# Patient Record
Sex: Male | Born: 1990 | Race: White | Hispanic: No | Marital: Single | Smoking: Never smoker
Health system: Southern US, Community
[De-identification: ages and names within clinical notes are randomized; demographics above are authoritative.]

## PROBLEM LIST (undated history)

## (undated) ENCOUNTER — Ambulatory Visit: Payer: Self-pay

---
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
## 2012-04-06 ENCOUNTER — Ambulatory Visit: Payer: Self-pay | Admitting: Family Medicine

## 2012-04-24 IMAGING — CR RIGHT ELBOW - COMPLETE 3+ VIEW
1 series · 4 of 4 positions shown · non-contrast
Comparison: none

REASON FOR EXAM: abscess
COMMENTS:   LMP: (Male)

PROCEDURE:     DXR - DXR ELBOW RT COMP W/OBLIQUES  - June 09, 2011 [DATE]
RESULT:     There is no evidence of fracture, dislocation, or malalignment.
There is no evidence of cortical destruction nor subcutaneous emphysema to
suggest the sequela of osteomyelitis.

[Series 1: x elbow lat right · 0.14mm/px · 4 of 4 slices shown]
[im 1/4]
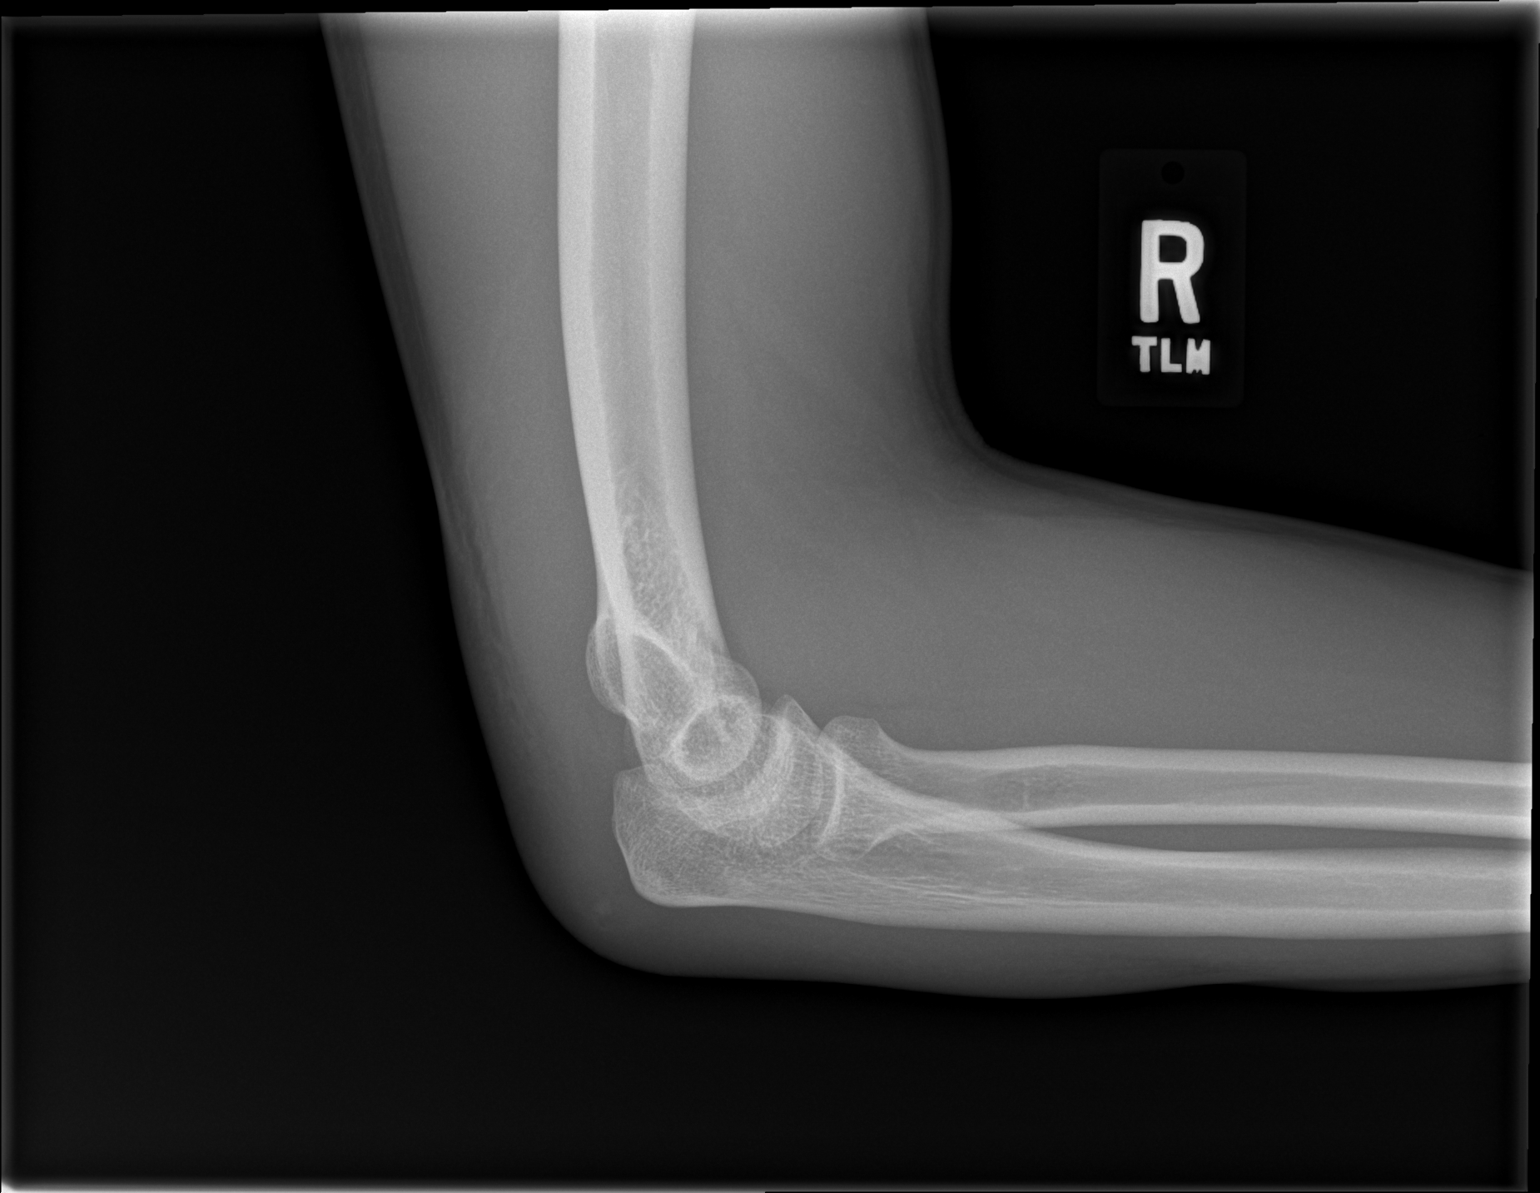
[im 2/4]
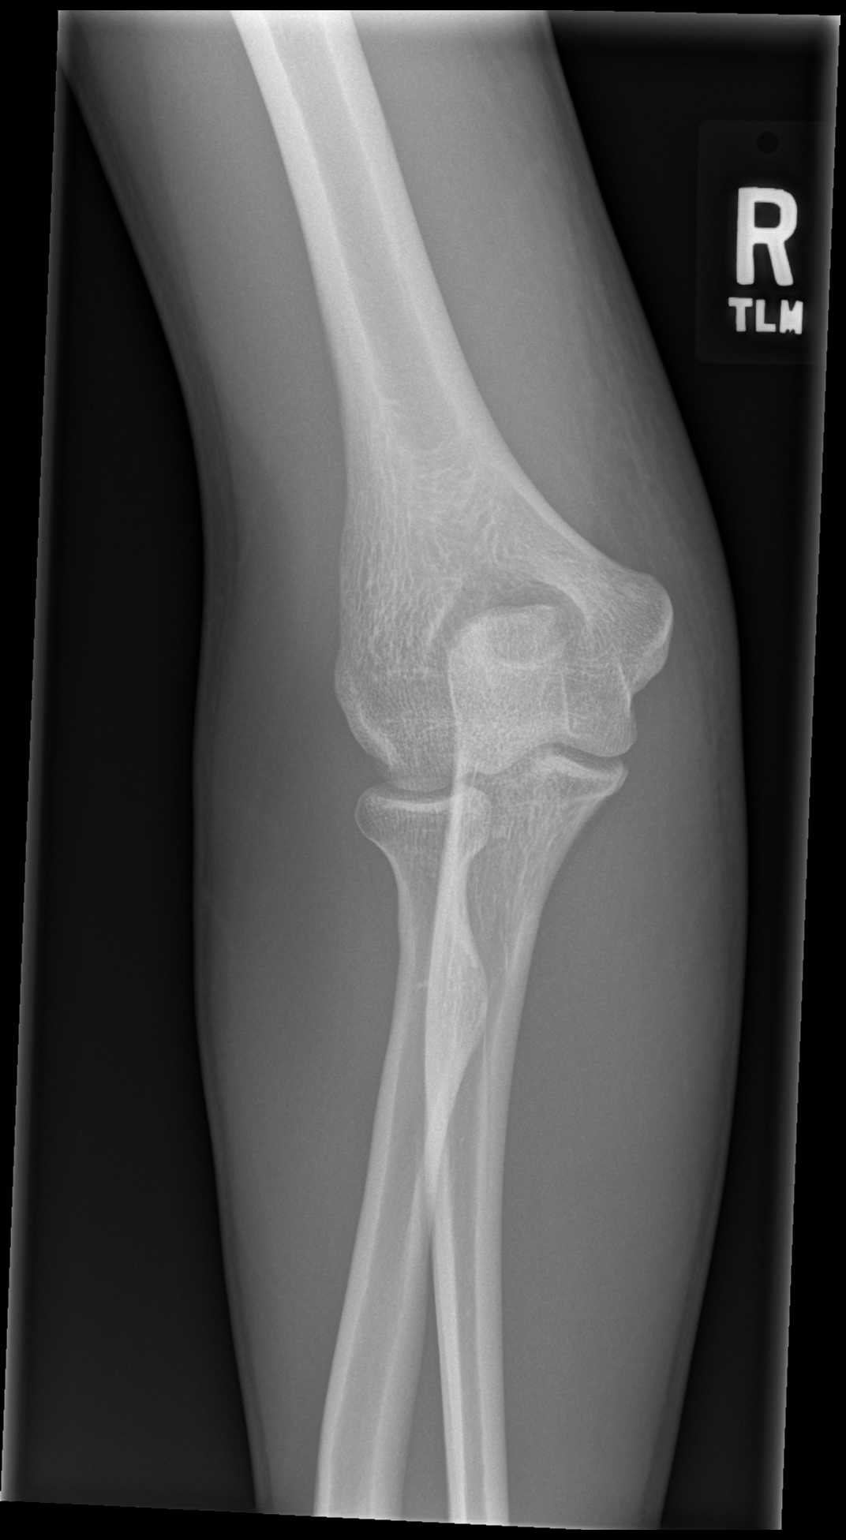
[im 3/4]
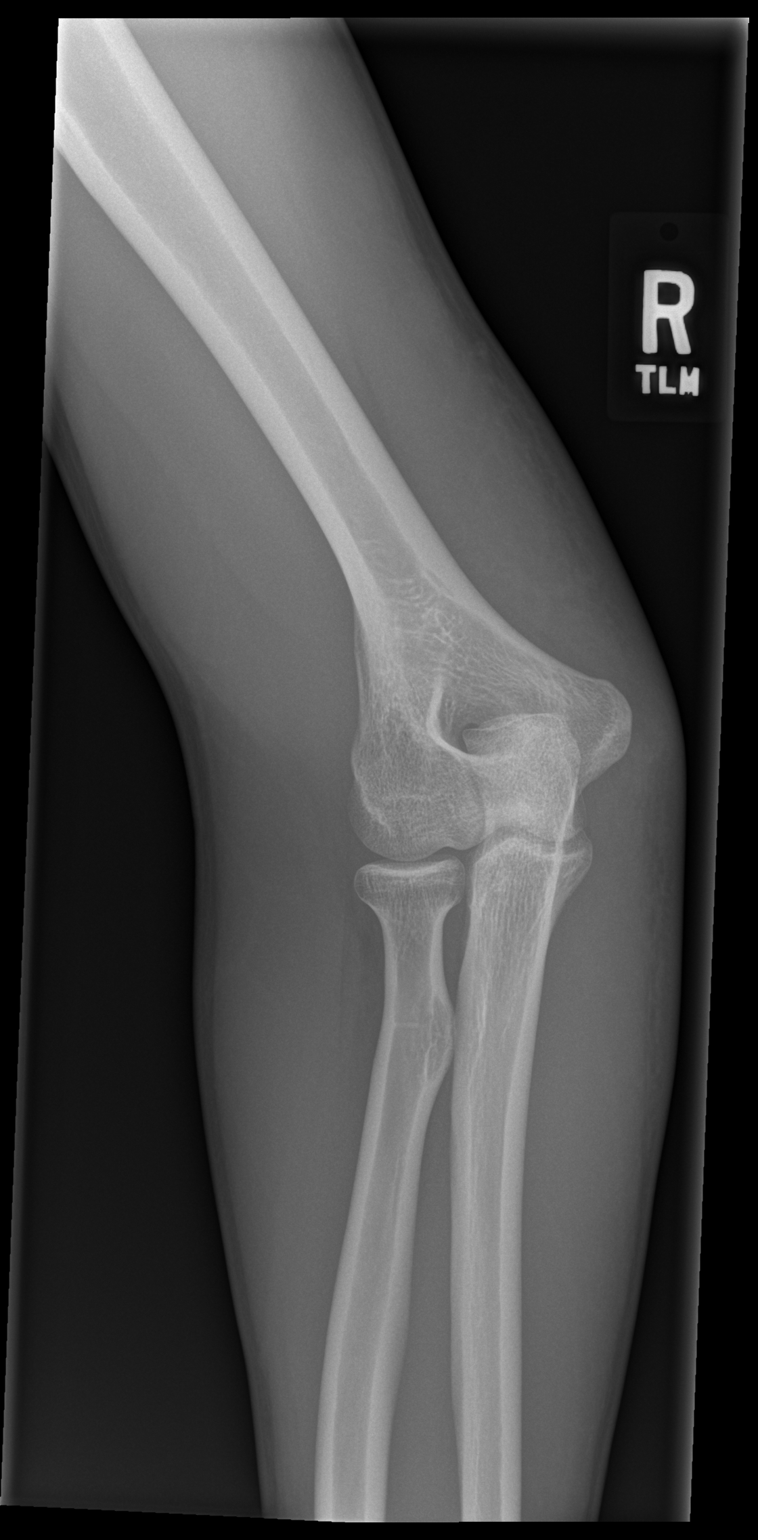
[im 4/4]
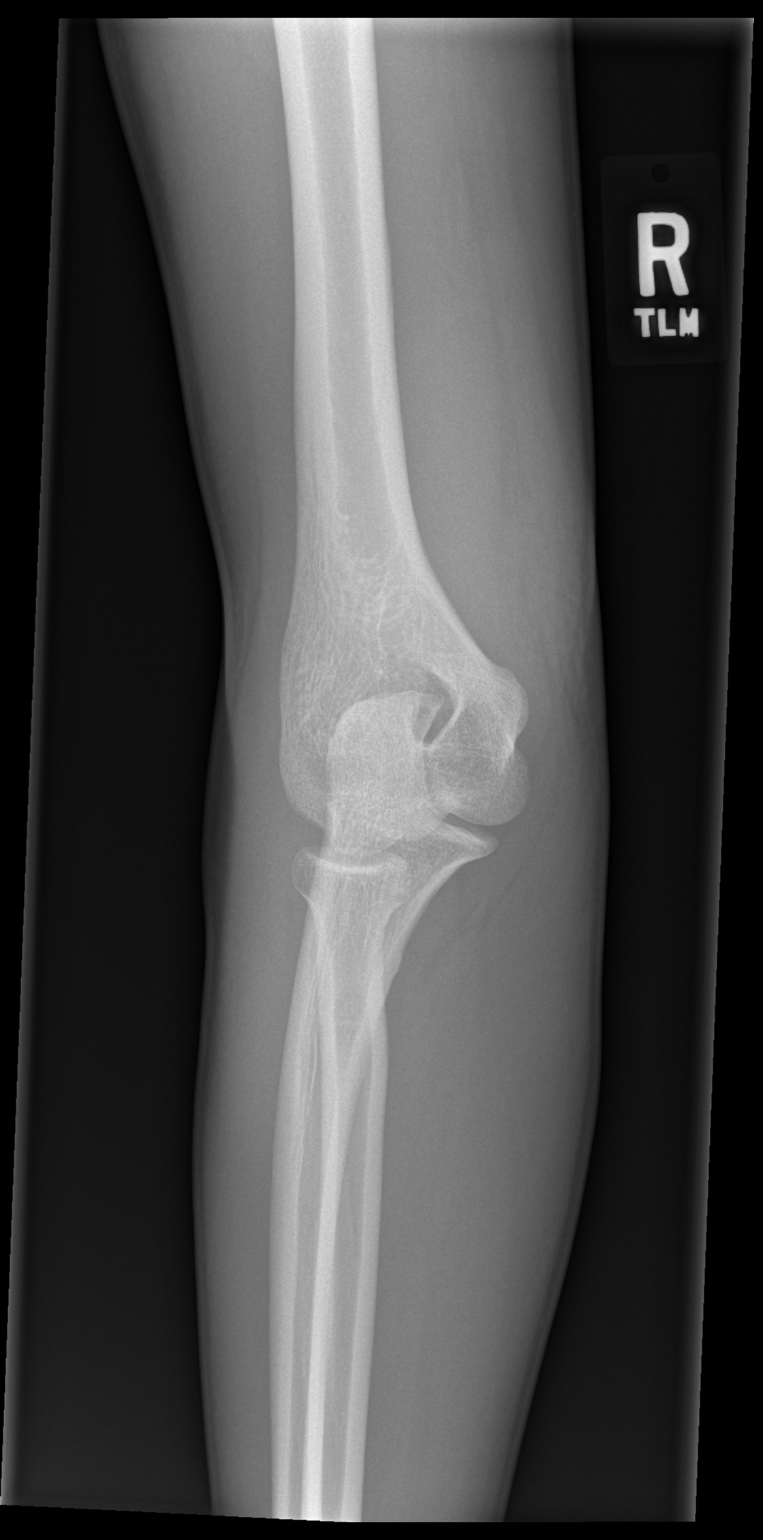

[4 of 4 positions shown; findings below may reference images not displayed]

IMPRESSION: 1. No evidence of acute abnormalities.
2. If there are persistent complaints of pain or persistent clinical
concern, a repeat evaluation in 7-10 days is recommended if clinically
warranted.

## 2013-01-10 ENCOUNTER — Ambulatory Visit: Payer: Self-pay | Admitting: Emergency Medicine

## 2013-08-22 ENCOUNTER — Ambulatory Visit: Payer: Self-pay | Admitting: Emergency Medicine

## 2018-09-27 ENCOUNTER — Other Ambulatory Visit: Payer: Self-pay

## 2018-09-27 ENCOUNTER — Encounter: Payer: Self-pay | Admitting: Emergency Medicine

## 2018-09-27 ENCOUNTER — Ambulatory Visit
Admission: EM | Admit: 2018-09-27 | Discharge: 2018-09-27 | Disposition: A | Discharge status: Home / Self Care | Payer: Self-pay | Attending: Family Medicine | Admitting: Family Medicine

## 2018-09-27 DIAGNOSIS — R05 Cough: Secondary | ICD-10-CM

## 2018-09-27 DIAGNOSIS — R0981 Nasal congestion: Secondary | ICD-10-CM

## 2018-09-27 DIAGNOSIS — J069 Acute upper respiratory infection, unspecified: Secondary | ICD-10-CM

## 2018-09-27 DIAGNOSIS — J029 Acute pharyngitis, unspecified: Secondary | ICD-10-CM

## 2018-09-27 LAB — RAPID STREP SCREEN (MED CTR MEBANE ONLY): Streptococcus, Group A Screen (Direct): NEGATIVE

## 2018-09-27 MED ORDER — IPRATROPIUM BROMIDE 0.06 % NA SOLN: 2.0000 | Freq: Four times a day (QID) | NASAL | 0 refills | Status: AC | PRN

## 2018-09-27 MED ORDER — CETIRIZINE-PSEUDOEPHEDRINE ER 5-120 MG PO TB12: 1.0000 | ORAL_TABLET | Freq: Two times a day (BID) | ORAL | 0 refills | Status: AC

## 2018-09-27 NOTE — ED Triage Notes (Signed)
Pt reports sore throat and nasal congestion, loss of taste and smell, reports sinus pressure headache x2 days. Pt denies fever at home.

## 2018-09-27 NOTE — Discharge Instructions (Signed)
We will call with the test results.  Medications as prescribed.  Take care  Dr. Lacinda Axon

## 2018-09-27 NOTE — ED Provider Notes (Signed)
[facility]    CSN: [id number] Arrival date & time: 09/27/18  1329  History   Chief Complaint Sore throat  HPI  28 year old male presents with sore throat.  Patient reports that he has had sore throat for the past 2 days.  Has now developed nasal congestion and rhinorrhea.  Mild headache.  No fever.  No reported sick contacts.  No medication interventions tried.  No known exacerbating/relieving factors.  No other complaints.  History reviewed as below. PMH: No significant PMH.  Home Medications    Prior to Admission medications   Medication Sig Start Date End Date Taking? Authorizing Provider  cetirizine-pseudoephedrine (ZYRTEC-D) 5-120 MG tablet Take 1 tablet by mouth 2 (two) times daily. 09/27/18   Thersa Salt G, DO  ipratropium (ATROVENT) 0.06 % nasal spray Place 2 sprays into both nostrils 4 (four) times daily as needed for rhinitis. 09/27/18   Coral Spikes, DO   Social History Social History   Tobacco Use  . Smoking status: Never Smoker  . Smokeless tobacco: Never Used  Substance Use Topics  . Alcohol use: Yes    Comment: occassionally  . Drug use: Never     Allergies   Patient has no known allergies.   Review of Systems Review of Systems  Constitutional: Negative for fever.  HENT: Positive for congestion, rhinorrhea and sore throat.    Physical Exam Triage Vital Signs ED Triage Vitals [09/27/18 1343]  Enc Vitals Group     BP (!) 128/92     Pulse Rate 84     Resp 16     Temp 98 F (36.7 C)     Temp Source Oral     SpO2 100 %     Weight 180 lb (81.6 kg)     Height 5\' 10"  (1.778 m)     Head Circumference      Peak Flow      Pain Score 1     Pain Loc      Pain Edu?      Excl. in [place]?    Updated Vital Signs BP (!) 128/92   Pulse 84   Temp 98 F (36.7 C) (Oral)   Resp 16   Ht 5\' 10"  (1.778 m)   Wt 81.6 kg   SpO2 100%   BMI 25.83 kg/m   Visual Acuity Right Eye Distance:   Left Eye Distance:   Bilateral Distance:    Right  Eye Near:   Left Eye Near:    Bilateral Near:     Physical Exam Vitals signs and nursing note reviewed.  Constitutional:      General: He is not in acute distress.    Appearance: Normal appearance.  HENT:     Head: Normocephalic and atraumatic.     Left Ear: Tympanic membrane normal.     Mouth/Throat:     Mouth: Mucous membranes are moist.     Comments: Oropharynx with mild erythema. Eyes:     General:        Right eye: No discharge.        Left eye: No discharge.     Conjunctiva/sclera: Conjunctivae normal.  Cardiovascular:     Rate and Rhythm: Normal rate and regular rhythm.  Pulmonary:     Effort: Pulmonary effort is normal.     Breath sounds: Normal breath sounds.  Neurological:     Mental Status: He is alert.  Psychiatric:        Mood and Affect: Mood  normal.        Behavior: Behavior normal.    UC Treatments / Results  Labs (all labs ordered are listed, but only abnormal results are displayed) Labs Reviewed  RAPID STREP SCREEN ([facility] ONLY)  NOVEL CORONAVIRUS, NAA (HOSPITAL ORDER, SEND-OUT TO REF LAB)  CULTURE, GROUP A STREP [facility])    EKG   Radiology No results found.  Procedures Procedures (including critical care time)  Medications Ordered in UC Medications - No data to display  Initial Impression / Assessment and Plan / UC Course  I have reviewed the triage vital signs and the nursing notes.  Pertinent labs & imaging results that were available during my care of the patient were reviewed by me and considered in my medical decision making (see chart for details).    28 year old male presents with a suspected URI.  Strep negative.  I suspect that this is a viral URI.  However, in the setting of recurrent pandemic I am awaiting COVID testing. Treating with Atrovent and Zyrtec D.  Final Clinical Impressions(s) / UC Diagnoses   Final diagnoses:  Upper respiratory tract infection, unspecified type     Discharge Instructions     We will  call with the test results.  Medications as prescribed.  Take care  Dr. Adriana Simasook     ED Prescriptions    Medication Sig Dispense Auth. Provider   ipratropium (ATROVENT) 0.06 % nasal spray Place 2 sprays into both nostrils 4 (four) times daily as needed for rhinitis. 15 mL Makell Cyr G, DO   cetirizine-pseudoephedrine (ZYRTEC-D) 5-120 MG tablet Take 1 tablet by mouth 2 (two) times daily. 30 tablet Tommie Samsook, Tandrea Kommer G, DO     Controlled Substance Prescriptions [place] Controlled Substance Registry consulted? Not Applicable   Tommie SamsCook, Kadi Hession G, DO 09/27/18 1536

## 2018-09-29 LAB — NOVEL CORONAVIRUS, NAA (HOSP ORDER, SEND-OUT TO REF LAB; TAT 18-24 HRS): SARS-CoV-2, NAA: NOT DETECTED

## 2018-09-30 ENCOUNTER — Encounter (HOSPITAL_COMMUNITY): Payer: Self-pay

## 2018-09-30 LAB — CULTURE, GROUP A STREP (THRC)

## 2020-10-19 ENCOUNTER — Emergency Department
Admission: EM | Admit: 2020-10-19 | Discharge: 2020-10-19 | Disposition: A | Discharge status: Home / Self Care | Payer: No Typology Code available for payment source | Attending: Emergency Medicine | Admitting: Emergency Medicine

## 2020-10-19 ENCOUNTER — Other Ambulatory Visit: Payer: Self-pay

## 2020-10-19 ENCOUNTER — Emergency Department: Payer: No Typology Code available for payment source

## 2020-10-19 DIAGNOSIS — Y9259 Other trade areas as the place of occurrence of the external cause: Secondary | ICD-10-CM | POA: Insufficient documentation

## 2020-10-19 DIAGNOSIS — S92425B Nondisplaced fracture of distal phalanx of left great toe, initial encounter for open fracture: Secondary | ICD-10-CM

## 2020-10-19 DIAGNOSIS — Z23 Encounter for immunization: Secondary | ICD-10-CM | POA: Insufficient documentation

## 2020-10-19 DIAGNOSIS — W208XXA Other cause of strike by thrown, projected or falling object, initial encounter: Secondary | ICD-10-CM | POA: Diagnosis not present

## 2020-10-19 DIAGNOSIS — Y990 Civilian activity done for income or pay: Secondary | ICD-10-CM | POA: Insufficient documentation

## 2020-10-19 DIAGNOSIS — S92424B Nondisplaced fracture of distal phalanx of right great toe, initial encounter for open fracture: Secondary | ICD-10-CM | POA: Diagnosis not present

## 2020-10-19 DIAGNOSIS — S99921A Unspecified injury of right foot, initial encounter: Secondary | ICD-10-CM | POA: Diagnosis present

## 2020-10-19 MED ORDER — OXYCODONE-ACETAMINOPHEN 5-325 MG PO TABS
1.0000 | ORAL_TABLET | Freq: Once | ORAL | Status: AC
  Administered 2020-10-19: 1 via ORAL
  Filled 2020-10-19: qty 1

## 2020-10-19 MED ORDER — CEPHALEXIN 500 MG PO CAPS: 500.0000 mg | ORAL_CAPSULE | Freq: Three times a day (TID) | ORAL | 0 refills | Status: AC

## 2020-10-19 MED ORDER — LIDOCAINE HCL 1 % IJ SOLN
10.0000 mL | Freq: Once | INTRAMUSCULAR | Status: DC
  Filled 2020-10-19: qty 10

## 2020-10-19 MED ORDER — TETANUS-DIPHTH-ACELL PERTUSSIS 5-2.5-18.5 LF-MCG/0.5 IM SUSY
0.5000 mL | PREFILLED_SYRINGE | Freq: Once | INTRAMUSCULAR | Status: AC
  Administered 2020-10-19: 0.5 mL via INTRAMUSCULAR
  Filled 2020-10-19: qty 0.5

## 2020-10-19 MED ORDER — ONDANSETRON 4 MG PO TBDP: 4.0000 mg | ORAL_TABLET | Freq: Three times a day (TID) | ORAL | 0 refills | Status: AC | PRN

## 2020-10-19 MED ORDER — IBUPROFEN 800 MG PO TABS
800.0000 mg | ORAL_TABLET | Freq: Once | ORAL | Status: AC
  Administered 2020-10-19: 800 mg via ORAL

## 2020-10-19 MED ORDER — HYDROCODONE-ACETAMINOPHEN 5-325 MG PO TABS: 1.0000 | ORAL_TABLET | Freq: Four times a day (QID) | ORAL | 0 refills | Status: AC | PRN

## 2020-10-19 MED ORDER — ONDANSETRON 4 MG PO TBDP
4.0000 mg | ORAL_TABLET | Freq: Once | ORAL | Status: AC
  Administered 2020-10-19: 4 mg via ORAL
  Filled 2020-10-19: qty 1

## 2020-10-19 NOTE — ED Notes (Signed)
Pt NAD with ortho shoe applied. Pt and family verbalize understanding of all DC and f/u instructions. All questions answered. Pt wheeled to lobby in wheelchair

## 2020-10-19 NOTE — Discharge Instructions (Addendum)
You can take Keflex 3 times daily for the next 7 days. You can take Norco for pain. You can start stool softener if you notice constipation from Norco. Please have sutures removed in 7 days. You can follow-up with Dr. Ether Griffins, podiatry.

## 2020-10-19 NOTE — ED Triage Notes (Signed)
Pt is an employee at [employer] and he dropped a large piece of wood on his right great toe, pt has laceration to toe and nail appears to be out of nail bed, bleeding controlled bandage in place.

## 2020-10-19 NOTE — ED Provider Notes (Signed)
[facility]EM[facility]ENC[facility]PART[facility]  ____________________________________________  Time seen: Approximately 11:39 PM  I have reviewed the triage vital signs and the nursing notes.   HISTORY  Chief Complaint Toe Injury   Historian Patient    HPI Jonathan RIESGO is a 30 y.o. male presents to the emergency department after patient dropped a large piece of wood on his right great toe causing fingernail to sublux.  Patient cannot recall his last tetanus shot.  No numbness or tingling in the right foot.  No similar injuries in the past.  Patient describes this as a workplace injury.   History reviewed. No pertinent past medical history.   Immunizations up to date:  Yes.     History reviewed. No pertinent past medical history.  There are no problems to display for this patient.   History reviewed. No pertinent surgical history.  Prior to Admission medications   Medication Sig Start Date End Date Taking? Authorizing Provider  cephALEXin (KEFLEX) 500 MG capsule Take 1 capsule (500 mg total) by mouth 3 (three) times daily for 7 days. 10/19/20 10/26/20 Yes Pia Mau M, PA-C  HYDROcodone-acetaminophen (NORCO) 5-325 MG tablet Take 1 tablet by mouth every 6 (six) hours as needed for up to 3 days. 10/19/20 10/22/20 Yes Pia Mau M, PA-C  ondansetron (ZOFRAN ODT) 4 MG disintegrating tablet Take 1 tablet (4 mg total) by mouth every 8 (eight) hours as needed for up to 5 days. 10/19/20 10/24/20 Yes Pia Mau M, PA-C  cetirizine-pseudoephedrine (ZYRTEC-D) 5-120 MG tablet Take 1 tablet by mouth 2 (two) times daily. 09/27/18   Everlene Other G, DO  ipratropium (ATROVENT) 0.06 % nasal spray Place 2 sprays into both nostrils 4 (four) times daily as needed for rhinitis. 09/27/18   Tommie Sams, DO    Allergies Patient has no known allergies.  No family history on file.  Social History Social History   Tobacco Use   Smoking status: Never   Smokeless tobacco: Never  Vaping Use   Vaping Use:  Every day  Substance Use Topics   Alcohol use: Yes    Comment: occassionally   Drug use: Never     Review of Systems  Constitutional: No fever/chills Eyes:  No discharge ENT: No upper respiratory complaints. Respiratory: no cough. No SOB/ use of accessory muscles to breath Gastrointestinal:   No nausea, no vomiting.  No diarrhea.  No constipation. Musculoskeletal: Patient has right great toe pain.  Skin: Negative for rash, abrasions, lacerations, ecchymosis.   ____________________________________________   PHYSICAL EXAM:  VITAL SIGNS: ED Triage Vitals  Enc Vitals Group     BP 10/19/20 2030 132/86     Pulse Rate 10/19/20 2030 71     Resp 10/19/20 2030 18     Temp 10/19/20 2030 97.6 F (36.4 C)     Temp Source 10/19/20 2030 Oral     SpO2 10/19/20 2030 98 %     Weight 10/19/20 2028 185 lb (83.9 kg)     Height 10/19/20 2028 5\' 11"  (1.803 m)     Head Circumference --      Peak Flow --      Pain Score 10/19/20 2028 7     Pain Loc --      Pain Edu? --      Excl. in [place]? --      Constitutional: Alert and oriented. Well appearing and in no acute distress. Eyes: Conjunctivae are normal. PERRL. EOMI. Head: Atraumatic. ENT:  Cardiovascular: Normal rate, regular rhythm. Normal S1  and S2.  Good peripheral circulation. Respiratory: Normal respiratory effort without tachypnea or retractions. Lungs CTAB. Good air entry to the bases with no decreased or absent breath sounds Gastrointestinal: Bowel sounds x 4 quadrants. Soft and nontender to palpation. No guarding or rigidity. No distention. Musculoskeletal: Full range of motion to all extremities.  Palpable dorsalis pedis pulse bilaterally and symmetrically.  Capillary refill less than 2 seconds bilaterally and symmetrically. Neurologic:  Normal for age. No gross focal neurologic deficits are appreciated.  Skin: Patient has a 1 and half centimeter laceration of right great toe with subluxed fingernail along the proximal aspect of  the nailbed. Psychiatric: Mood and affect are normal for age. Speech and behavior are normal.   ____________________________________________   LABS (all labs ordered are listed, but only abnormal results are displayed)  Labs Reviewed - No data to display ____________________________________________  EKG   ____________________________________________  RADIOLOGY Geraldo Pitter, personally viewed and evaluated these images (plain radiographs) as part of my medical decision making, as well as reviewing the written report by the radiologist.  DG Foot Complete Right  Result Date: 10/19/2020 CLINICAL DATA:  Crush injury EXAM: RIGHT FOOT COMPLETE - 3+ VIEW COMPARISON:  None. FINDINGS: Acute mildly comminuted and displaced fracture involving the tuft of the first distal phalanx with possible small additional fracture at the base of the first distal phalanx. No malalignment. No radiopaque foreign body in the soft tissues IMPRESSION: Acute mildly comminuted and displaced fracture involving tuft of first distal phalanx with possible additional fracture involving the lateral base of the first distal phalanx Electronically Signed   By: Jasmine Pang M.D.   On: 10/19/2020 20:51    ____________________________________________    PROCEDURES  Procedure(s) performed:     Marland KitchenMarland KitchenLaceration Repair  Date/Time: 10/19/2020 11:42 PM Performed by: Orvil Feil, PA-C Authorized by: Orvil Feil, PA-C   Consent:    Consent obtained:  Verbal   Risks discussed:  Infection and pain Universal protocol:    Procedure explained and questions answered to patient or proxy's satisfaction: yes     Patient identity confirmed:  Verbally with patient Anesthesia:    Anesthesia method:  Local infiltration   Local anesthetic:  Lidocaine 1% w/o epi Laceration details:    Location:  Toe   Toe location:  R big toe   Length (cm):  1   Depth (mm):  5 Exploration:    Limited defect created (wound extended):  yes   Treatment:    Area cleansed with:  Povidone-iodine   Irrigation solution:  Sterile saline   Irrigation volume:  500   Debridement:  None   Undermining:  None Skin repair:    Repair method:  Tissue adhesive and sutures   Suture size:  4-0   Suture technique:  Simple interrupted   Number of sutures:  5 Approximation:    Approximation:  Close Repair type:    Repair type:  Complex Post-procedure details:    Dressing:  Adhesive bandage Comments:     Laceration along lateral aspect of right great toe was repaired with sutures.  Toenail  was repositioned within proximal nail fold and Dermabond was used to secure nail.     Medications  lidocaine (XYLOCAINE) 1 % (with pres) injection 10 mL (has no administration in time range)  ibuprofen (ADVIL) tablet 800 mg (800 mg Oral Given 10/19/20 2033)  Tdap (BOOSTRIX) injection 0.5 mL (0.5 mLs Intramuscular Given 10/19/20 2246)  oxyCODONE-acetaminophen (PERCOCET/ROXICET) 5-325 MG per tablet 1 tablet (  1 tablet Oral Given 10/19/20 2317)  ondansetron (ZOFRAN-ODT) disintegrating tablet 4 mg (4 mg Oral Given 10/19/20 2318)     ____________________________________________   INITIAL IMPRESSION / ASSESSMENT AND PLAN / ED COURSE  Pertinent labs & imaging results that were available during my care of the patient were reviewed by me and considered in my medical decision making (see chart for details).    Assessment and plan Toe laceration 30 year old male presents to the emergency department with an open distal tuft fracture with laceration repaired in the emergency department without complication.  Vital signs are reassuring at triage.  On physical exam, patient was alert, active and nontoxic-appearing.  Laceration was repaired using sutures and patient's toenail was repositioned within the proximal nail fold and Dermabond was applied to secure nail in place.  Patient's tetanus status was updated in the emergency department he was discharged with  Keflex to treat open fracture.  Patient was placed in a postop shoe and advised of follow-up with podiatry, Dr. Ether Griffins.     ____________________________________________  FINAL CLINICAL IMPRESSION(S) / ED DIAGNOSES  Final diagnoses:  Open nondisplaced fracture of distal phalanx of left great toe, initial encounter      NEW MEDICATIONS STARTED DURING THIS VISIT:  ED Discharge Orders          Ordered    cephALEXin (KEFLEX) 500 MG capsule  3 times daily        10/19/20 2253    HYDROcodone-acetaminophen (NORCO) 5-325 MG tablet  Every 6 hours PRN        10/19/20 2254    ondansetron (ZOFRAN ODT) 4 MG disintegrating tablet  Every 8 hours PRN        10/19/20 2254                This chart was dictated using voice recognition software/Dragon. Despite best efforts to proofread, errors can occur which can change the meaning. Any change was purely unintentional.     Gasper Lloyd 10/19/20 2352    Sharman Cheek, MD 11/10/20 [phone]

## 2021-09-04 IMAGING — CR DG FOOT COMPLETE 3+V*R*
1 series · 3 of 3 positions shown · non-contrast
Comparison: None.

CLINICAL DATA: Crush injury

EXAM:
RIGHT FOOT COMPLETE - 3+ VIEW

[Series 1: dg foot complete right · 0.14mm/px · 3 of 3 slices shown]
[im 1/3]
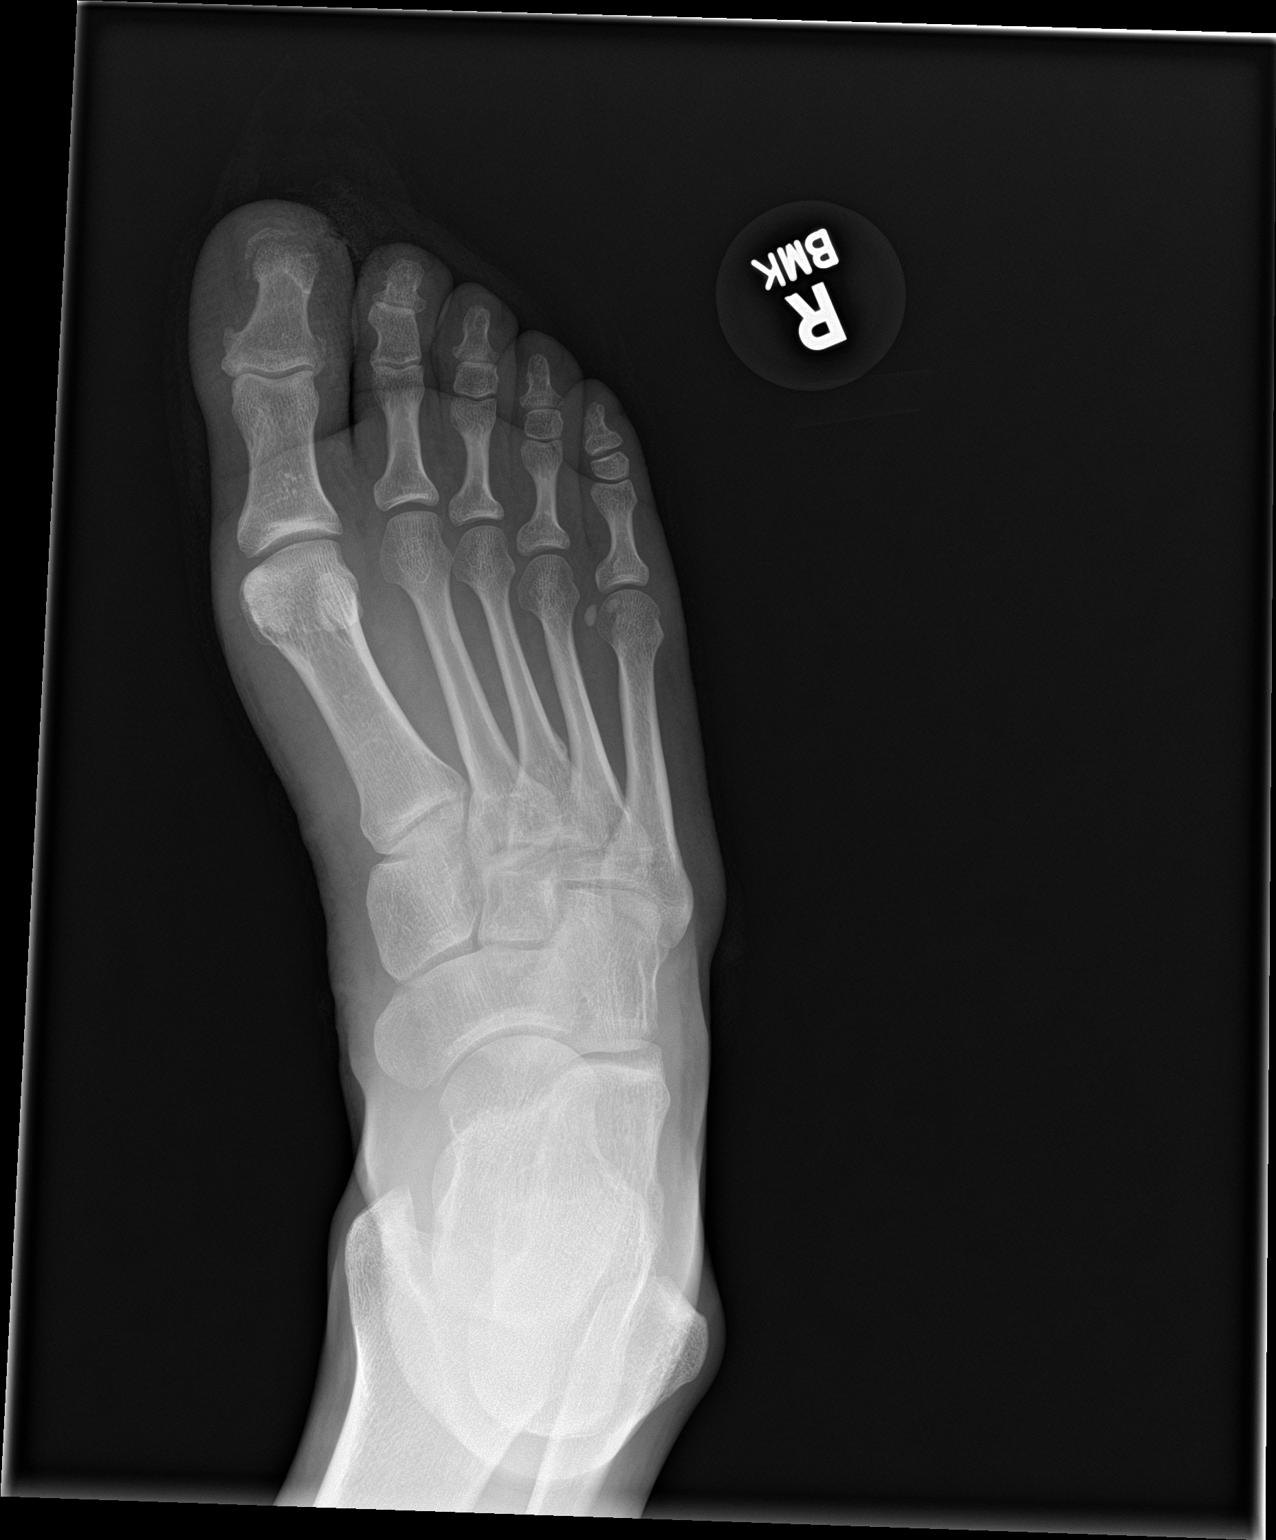
[im 2/3]
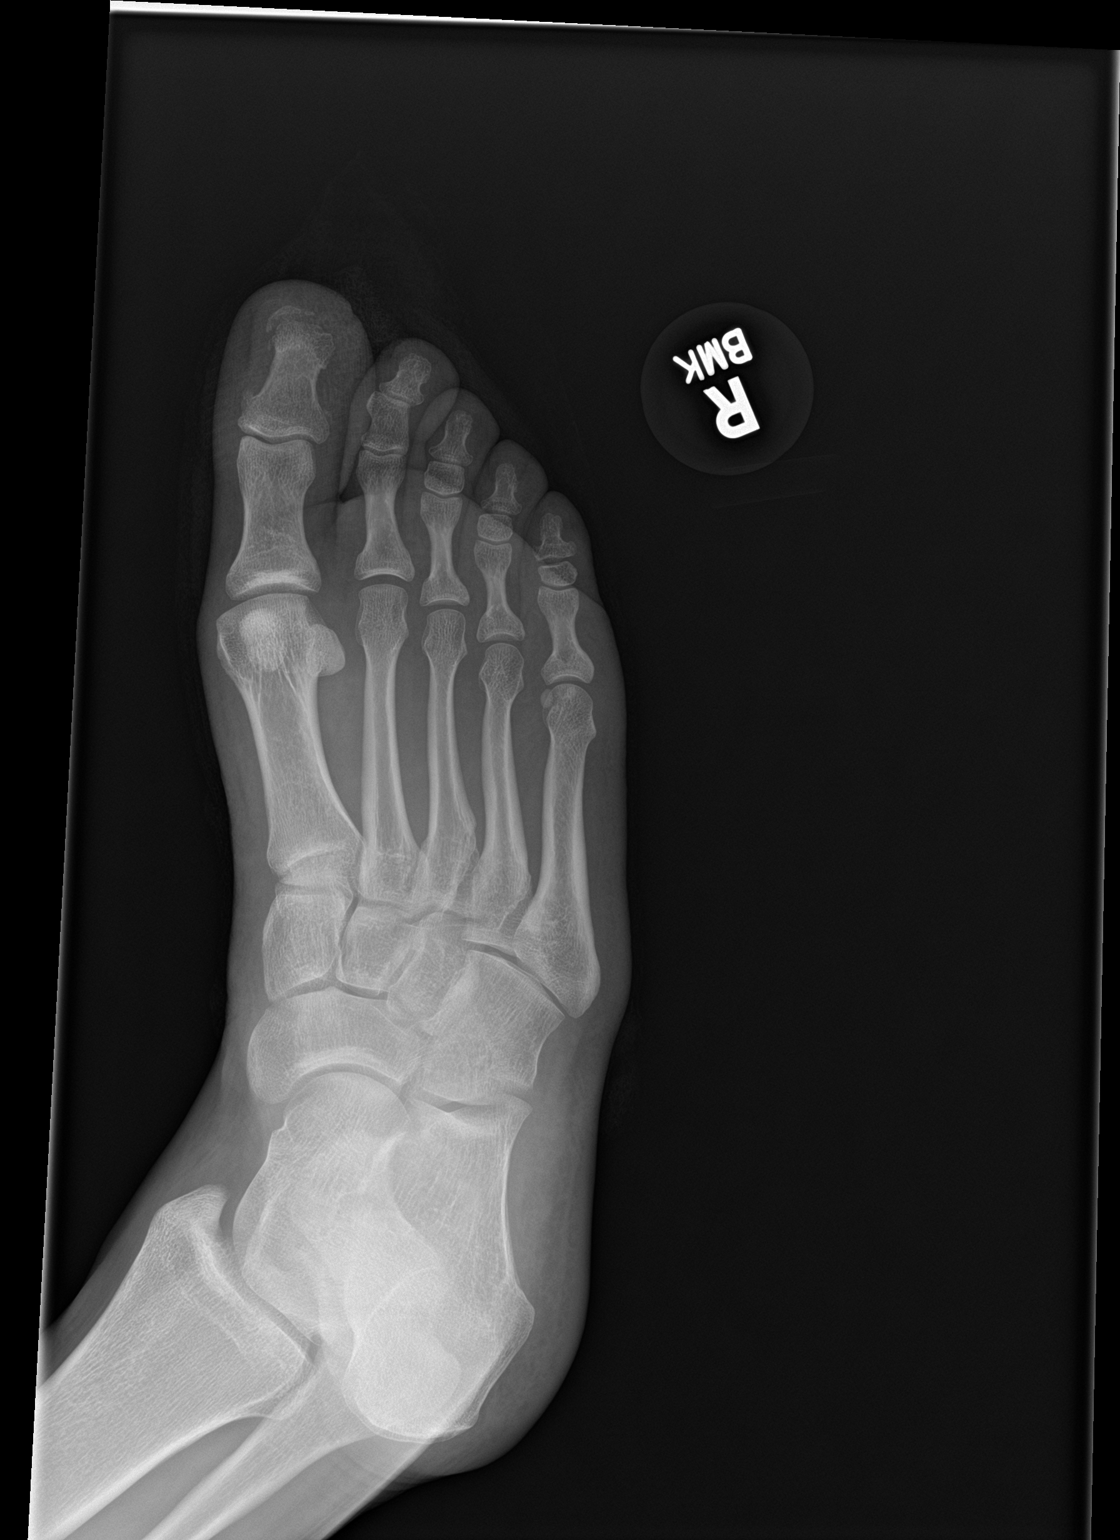
[im 3/3]
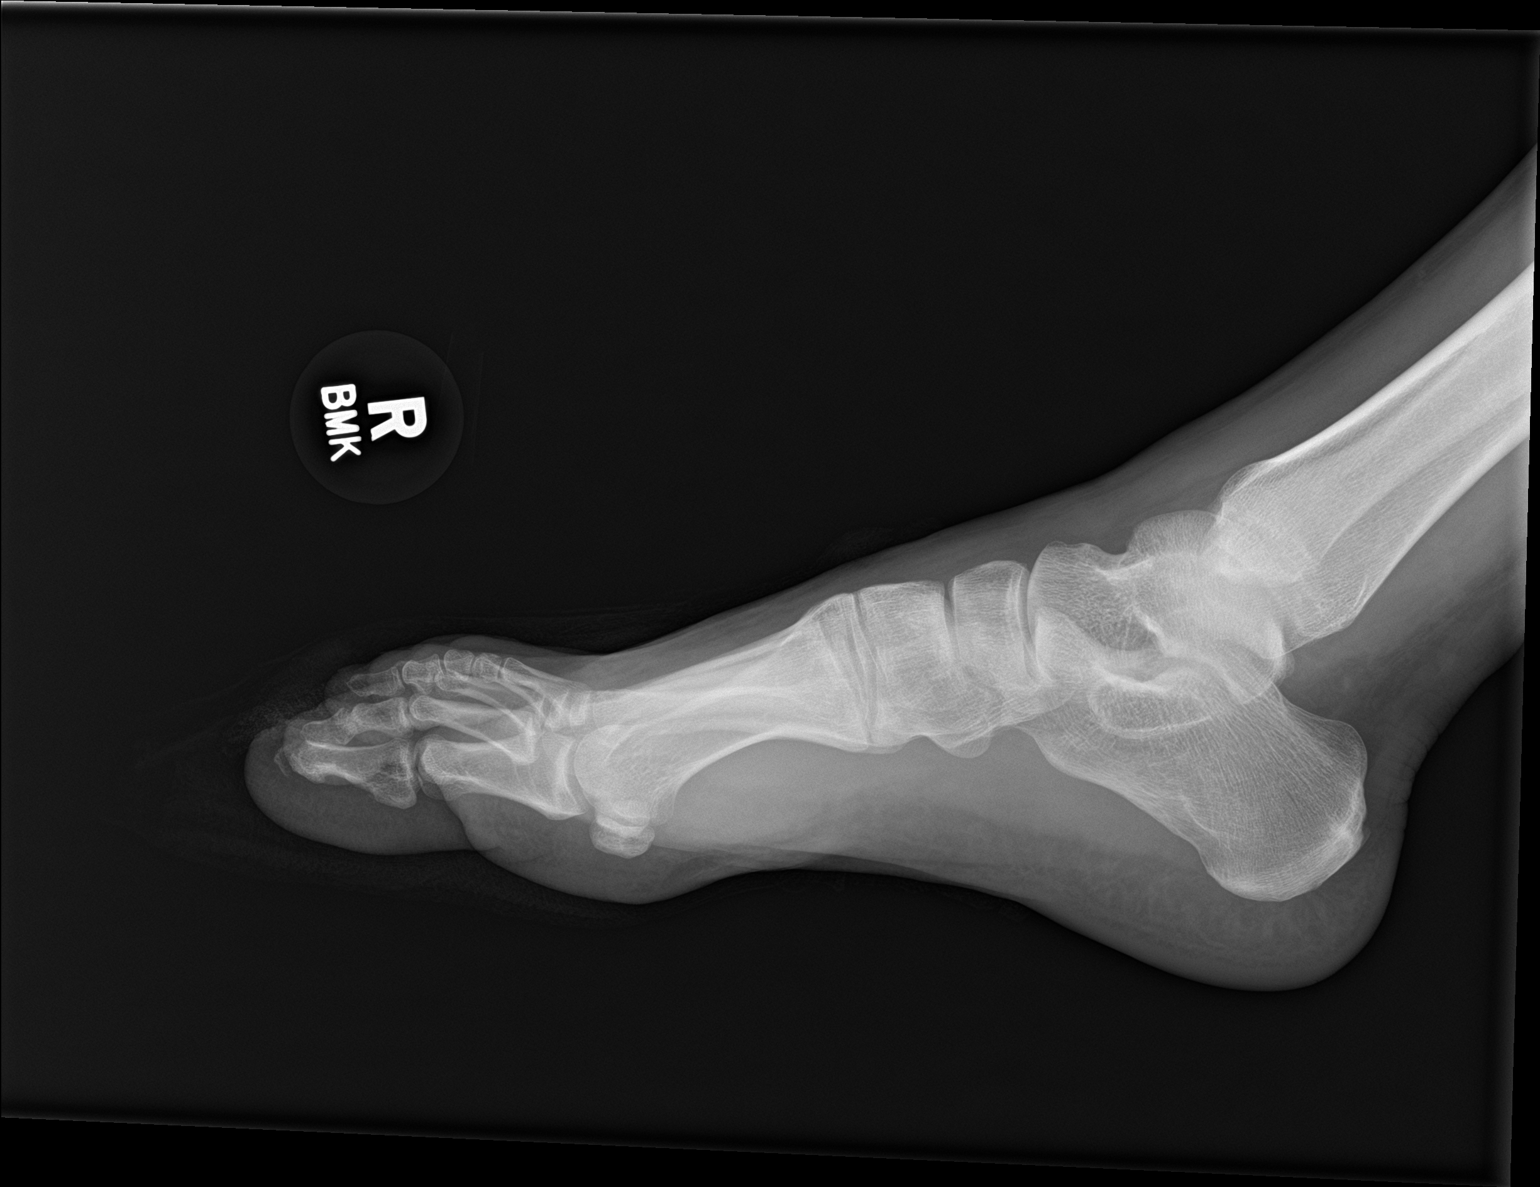

[3 of 3 positions shown; findings below may reference images not displayed]

FINDINGS: Acute mildly comminuted and displaced fracture involving the tuft of
the first distal phalanx with possible small additional fracture at
the base of the first distal phalanx. No malalignment. No radiopaque
foreign body in the soft tissues
IMPRESSION: Acute mildly comminuted and displaced fracture involving tuft of
first distal phalanx with possible additional fracture involving the
lateral base of the first distal phalanx

## 2022-07-03 ENCOUNTER — Ambulatory Visit (INDEPENDENT_AMBULATORY_CARE_PROVIDER_SITE_OTHER): Payer: Self-pay

## 2022-07-03 ENCOUNTER — Ambulatory Visit
Admission: EM | Admit: 2022-07-03 | Discharge: 2022-07-03 | Disposition: A | Discharge status: Home / Self Care | Payer: Self-pay | Attending: Physician Assistant | Admitting: Physician Assistant

## 2022-07-03 DIAGNOSIS — R0781 Pleurodynia: Secondary | ICD-10-CM

## 2022-07-03 DIAGNOSIS — R0789 Other chest pain: Secondary | ICD-10-CM

## 2022-07-03 DIAGNOSIS — T148XXA Other injury of unspecified body region, initial encounter: Secondary | ICD-10-CM

## 2022-07-03 NOTE — ED Triage Notes (Signed)
left side rib pain; SOB; x 4 days; Pt states that he fell in shower 4 days ago. Doesn't really hurt when he breathes in deep. Hurts when he moves a certain way. Took tylenol for pain. Last dose was Friday.

## 2022-07-03 NOTE — ED Provider Notes (Signed)
[facility]    CSN: [id number] Arrival date & time: 07/03/22  1114      History   Chief Complaint Chief Complaint  Patient presents with   Rib Injury    HPI Jonathan Holloway is a 32 y.o. male presenting for approximately 4 day history of left-sided rib pain and shortness of breath.  Patient says that he fell in the shower a few days ago.  He says he does not really have any increased pain when he takes a deep breath but he does have increased pain when he moves a certain way. States he had a bruise that resolved. No pain when touching his ribs.  He has been taking Tylenol for the pain. No other concerns.  HPI  History reviewed. No pertinent past medical history.  There are no problems to display for this patient.   History reviewed. No pertinent surgical history.     Home Medications    Prior to Admission medications   Medication Sig Start Date End Date Taking? Authorizing Provider  cetirizine-pseudoephedrine (ZYRTEC-D) 5-120 MG tablet Take 1 tablet by mouth 2 (two) times daily. 09/27/18   Everlene Other G, DO  ipratropium (ATROVENT) 0.06 % nasal spray Place 2 sprays into both nostrils 4 (four) times daily as needed for rhinitis. 09/27/18   Tommie Sams, DO    Family History History reviewed. No pertinent family history.  Social History Social History   Tobacco Use   Smoking status: Never   Smokeless tobacco: Never  Vaping Use   Vaping Use: Every day  Substance Use Topics   Alcohol use: Yes    Comment: occassionally   Drug use: Never     Allergies   Patient has no known allergies.   Review of Systems Review of Systems  Constitutional:  Negative for fatigue.  Respiratory:  Positive for shortness of breath. Negative for cough.   Cardiovascular:  Negative for chest pain and palpitations.  Gastrointestinal:  Negative for abdominal pain, nausea and vomiting.  Musculoskeletal:  Positive for arthralgias (left rib pain). Negative for back pain.   Skin:  Negative for color change and wound.  Neurological:  Negative for weakness.     Physical Exam Triage Vital Signs ED Triage Vitals  Enc Vitals Group     BP 07/03/22 1122 135/81     Pulse Rate 07/03/22 1122 68     Resp 07/03/22 1122 18     Temp 07/03/22 1122 97.9 F (36.6 C)     Temp Source 07/03/22 1122 Oral     SpO2 07/03/22 1122 99 %     Weight 07/03/22 1121 200 lb (90.7 kg)     Height --      Head Circumference --      Peak Flow --      Pain Score 07/03/22 1121 6     Pain Loc --      Pain Edu? --      Excl. in [place]? --    No data found.  Updated Vital Signs BP 135/81 (BP Location: Right Arm)   Pulse 68   Temp 97.9 F (36.6 C) (Oral)   Resp 18   Wt 200 lb (90.7 kg)   SpO2 99%   BMI 27.89 kg/m   Physical Exam Vitals and nursing note reviewed.  Constitutional:      General: He is not in acute distress.    Appearance: Normal appearance. He is well-developed. He is not ill-appearing.  HENT:  Head: Normocephalic and atraumatic.  Eyes:     General: No scleral icterus.    Conjunctiva/sclera: Conjunctivae normal.  Cardiovascular:     Rate and Rhythm: Normal rate and regular rhythm.     Heart sounds: Normal heart sounds.  Pulmonary:     Effort: Pulmonary effort is normal. No respiratory distress.     Breath sounds: Normal breath sounds.  Chest:     Chest wall: No tenderness.  Abdominal:     Palpations: Abdomen is soft.     Tenderness: There is no abdominal tenderness.  Musculoskeletal:        General: No swelling.     Cervical back: Neck supple.  Skin:    General: Skin is warm and dry.     Capillary Refill: Capillary refill takes less than 2 seconds.     Findings: No bruising.  Neurological:     General: No focal deficit present.     Mental Status: He is alert. Mental status is at baseline.     Motor: No weakness.     Gait: Gait normal.  Psychiatric:        Mood and Affect: Mood normal.      UC Treatments / Results  Labs (all labs  ordered are listed, but only abnormal results are displayed) Labs Reviewed - No data to display  EKG   Radiology DG Ribs Unilateral W/Chest Left  Result Date: 07/03/2022 CLINICAL DATA:  Left rib pain after falling in the shower. EXAM: LEFT RIBS AND CHEST - 3+ VIEW COMPARISON:  Chest x-ray 01/10/2013 FINDINGS: The lungs are clear without focal pneumonia, edema, pneumothorax or pleural effusion. The cardiopericardial silhouette is within normal limits for size. Oblique views of the left ribs obtained. Radio-opaque marker has been placed on the skin at the site of patient concern. IMPRESSION: Negative. Electronically Signed   By: Kennith Center M.D.   On: 07/03/2022 12:04    Procedures Procedures (including critical care time)  Medications Ordered in UC Medications - No data to display  Initial Impression / Assessment and Plan / UC Course  I have reviewed the triage vital signs and the nursing notes.  Pertinent labs & imaging results that were available during my care of the patient were reviewed by me and considered in my medical decision making (see chart for details).   32 year old male presenting for approximate 4-day history of left-sided rib pain and shortness of breath after fall in the shower.  Left rib and chest x-ray ordered today to assess for underlying rib fracture, pneumothorax, etc. X-ray normal.  Discussed results with patient.  Advised him symptoms are consistent with muscle strain.  Advised starting Aleve and using muscle rubs, heat and ice.  Continue Tylenol as needed.  Return precautions discussed.   Final Clinical Impressions(s) / UC Diagnoses   Final diagnoses:  Muscle strain  Rib pain on left side     Discharge Instructions      -No fractures or abnormalities of your x-ray today. Likely strained muscles -Start taking Aleve and continue Tylenol if needed. Also try ice, heat, icyhot, lidocaine patches     ED Prescriptions   None    PDMP not reviewed  this encounter.   Shirlee Latch, PA-C 07/03/22 1242

## 2022-07-03 NOTE — Discharge Instructions (Signed)
-  No fractures or abnormalities of your x-ray today. Likely strained muscles -Start taking Aleve and continue Tylenol if needed. Also try ice, heat, icyhot, lidocaine patches
